# Patient Record
Sex: Female | Born: 1960 | Race: White | Hispanic: No | Marital: Single | Smoking: Current every day smoker
Health system: Southern US, Community
[De-identification: ages and names within clinical notes are randomized; demographics above are authoritative.]

## PROBLEM LIST (undated history)

## (undated) DIAGNOSIS — R519 Headache, unspecified: Secondary | ICD-10-CM

## (undated) DIAGNOSIS — F329 Major depressive disorder, single episode, unspecified: Secondary | ICD-10-CM

## (undated) DIAGNOSIS — I10 Essential (primary) hypertension: Secondary | ICD-10-CM

## (undated) DIAGNOSIS — N951 Menopausal and female climacteric states: Secondary | ICD-10-CM

## (undated) DIAGNOSIS — F32A Depression, unspecified: Secondary | ICD-10-CM

## (undated) DIAGNOSIS — K219 Gastro-esophageal reflux disease without esophagitis: Secondary | ICD-10-CM

## (undated) DIAGNOSIS — F419 Anxiety disorder, unspecified: Secondary | ICD-10-CM

## (undated) DIAGNOSIS — R51 Headache: Secondary | ICD-10-CM

## (undated) DIAGNOSIS — R6882 Decreased libido: Secondary | ICD-10-CM

## (undated) DIAGNOSIS — M542 Cervicalgia: Secondary | ICD-10-CM

## (undated) DIAGNOSIS — D649 Anemia, unspecified: Secondary | ICD-10-CM

## (undated) DIAGNOSIS — F909 Attention-deficit hyperactivity disorder, unspecified type: Secondary | ICD-10-CM

## (undated) HISTORY — DX: Major depressive disorder, single episode, unspecified: F32.9

## (undated) HISTORY — DX: Headache: R51

## (undated) HISTORY — PX: BLADDER SURGERY: SHX569

## (undated) HISTORY — DX: Headache, unspecified: R51.9

## (undated) HISTORY — PX: ABDOMINAL HYSTERECTOMY: SHX81

## (undated) HISTORY — DX: Attention-deficit hyperactivity disorder, unspecified type: F90.9

## (undated) HISTORY — DX: Depression, unspecified: F32.A

## (undated) HISTORY — DX: Essential (primary) hypertension: I10

## (undated) HISTORY — PX: BREAST ENHANCEMENT SURGERY: SHX7

## (undated) HISTORY — DX: Anxiety disorder, unspecified: F41.9

## (undated) HISTORY — DX: Cervicalgia: M54.2

## (undated) HISTORY — DX: Menopausal and female climacteric states: N95.1

## (undated) HISTORY — DX: Gastro-esophageal reflux disease without esophagitis: K21.9

## (undated) HISTORY — DX: Decreased libido: R68.82

## (undated) HISTORY — PX: CARPAL TUNNEL RELEASE: SHX101

## (undated) HISTORY — DX: Anemia, unspecified: D64.9

---

## 2007-08-07 ENCOUNTER — Ambulatory Visit: Payer: Self-pay | Admitting: Oncology

## 2009-01-31 ENCOUNTER — Ambulatory Visit: Payer: Self-pay | Admitting: Internal Medicine

## 2009-09-27 IMAGING — CT CT HEAD WITHOUT CONTRAST
2 series · 16 of 30 positions shown, 20 images · non-contrast
Comparison: none

REASON FOR EXAM: fall    HA    memory loss   CALL report  [id number]
COMMENTS:

[Series 2: without · axial · non-contrast · 0.39mm/px · z∈[+683,+803]mm · 13 of 30 slices shown, 17 images]
[im 3/30  brain]
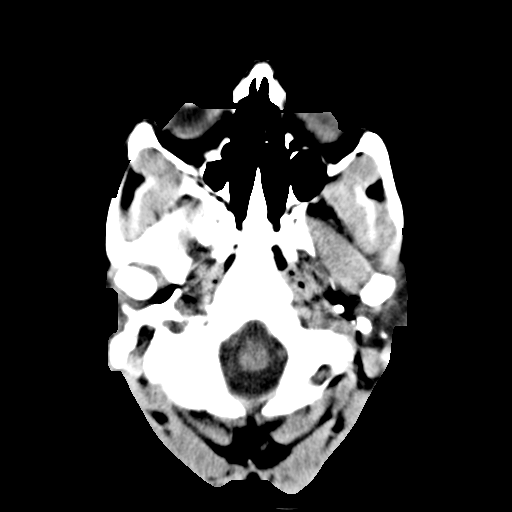
[im 3/30  bone]
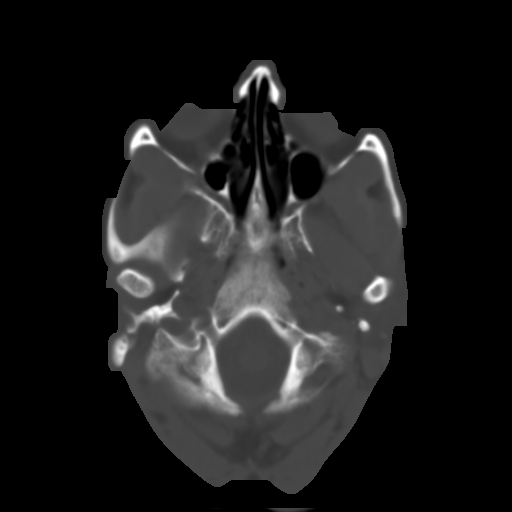
[im 5/30  brain]
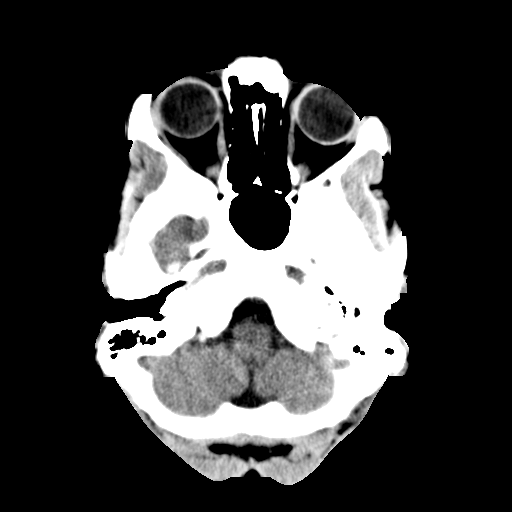
[im 7/30  brain]
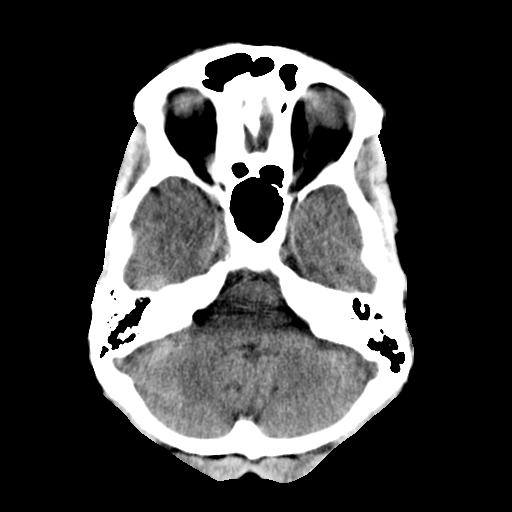
[im 9/30  brain]
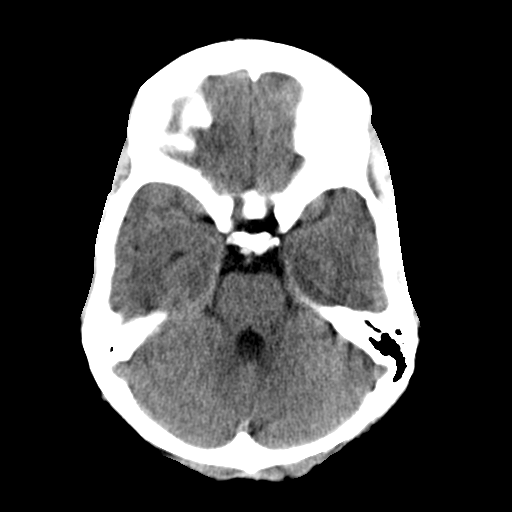
[im 11/30  brain]
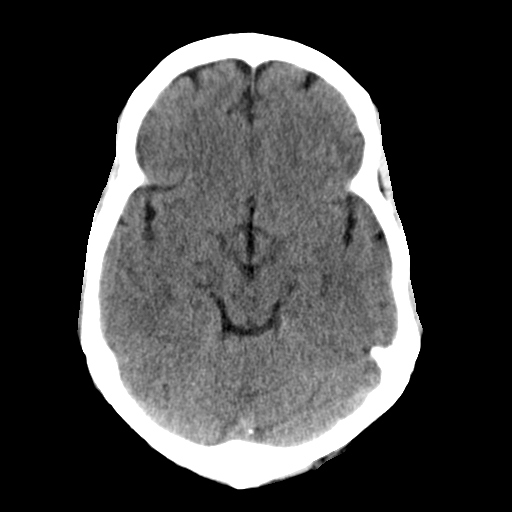
[im 11/30  bone]
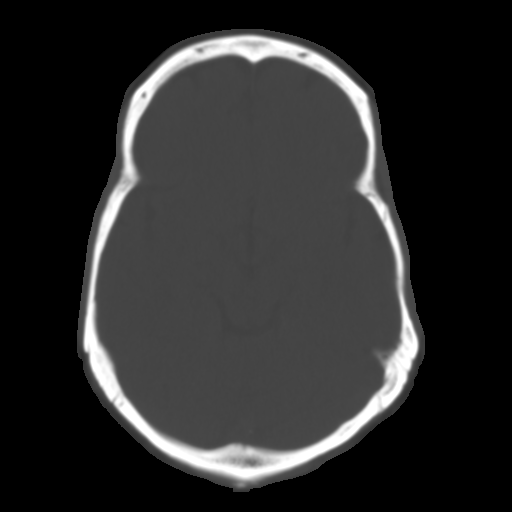
[im 13/30  brain]
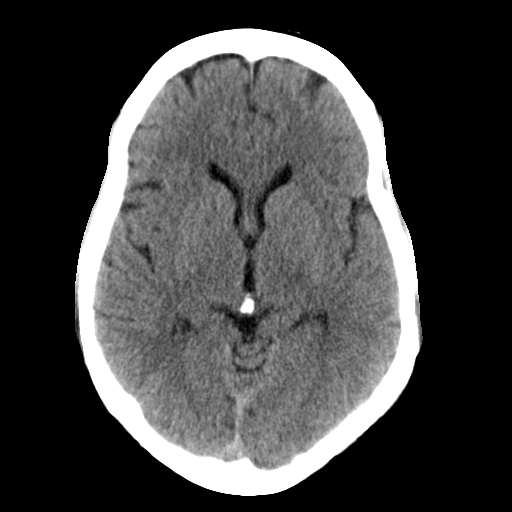
[im 15/30  brain]
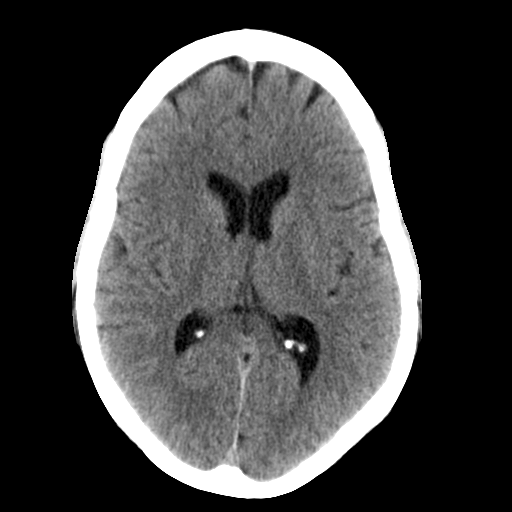
[im 17/30  brain]
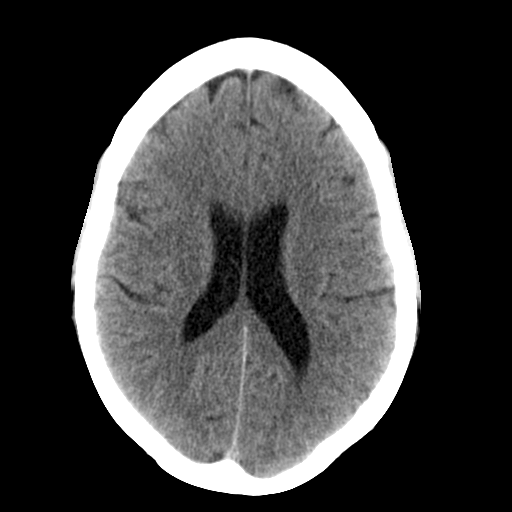
[im 19/30  brain]
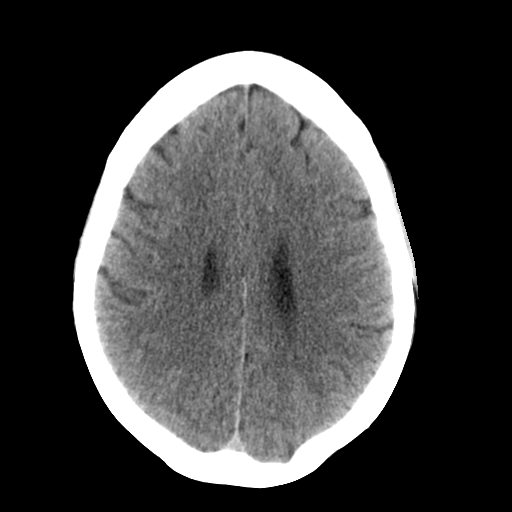
[im 19/30  bone]
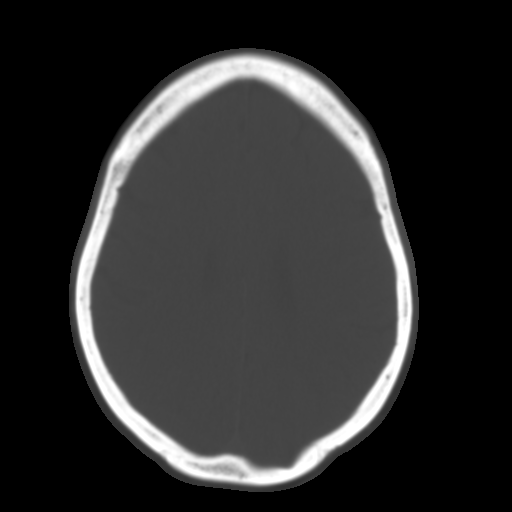
[im 21/30  brain]
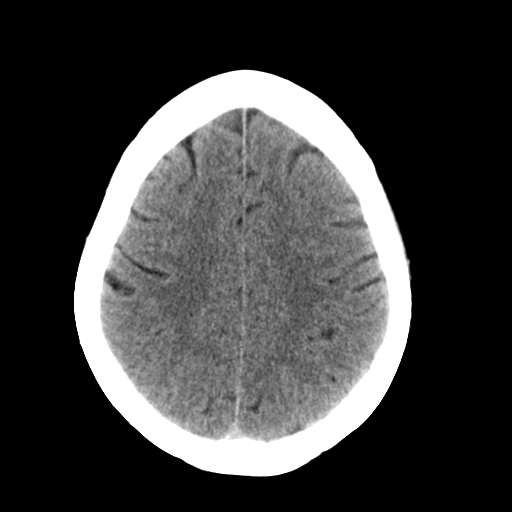
[im 23/30  brain]
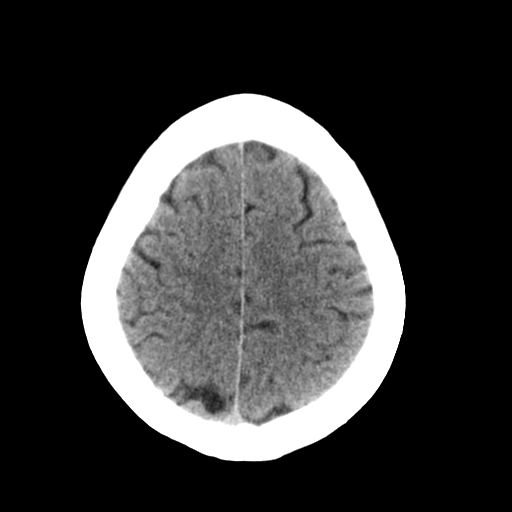
[im 25/30  brain]
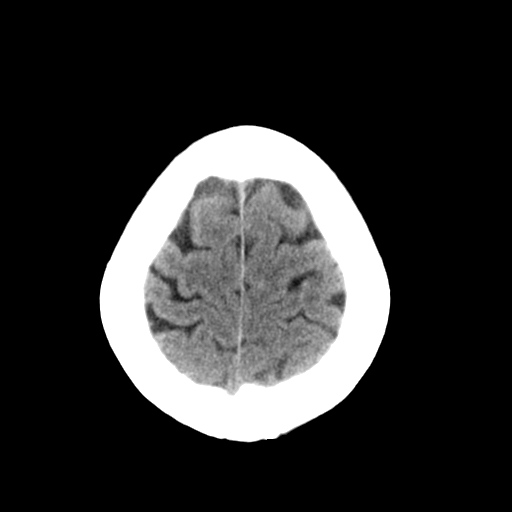
[im 27/30  brain]
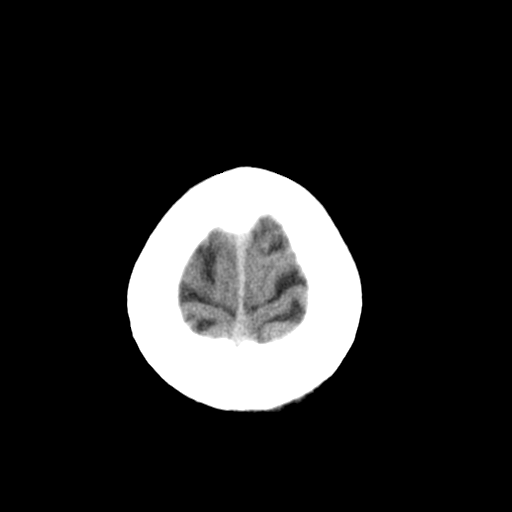
[im 27/30  bone]
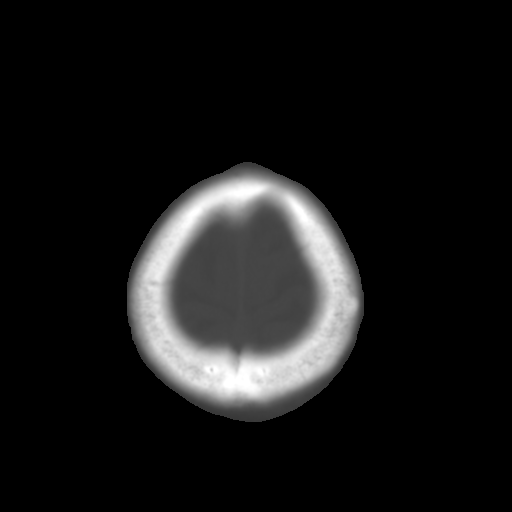

[Series 3: bone · axial · 0.39mm/px · z∈[+683,+723]mm · 3 of 30 slices shown]
[im 3/30  bone]
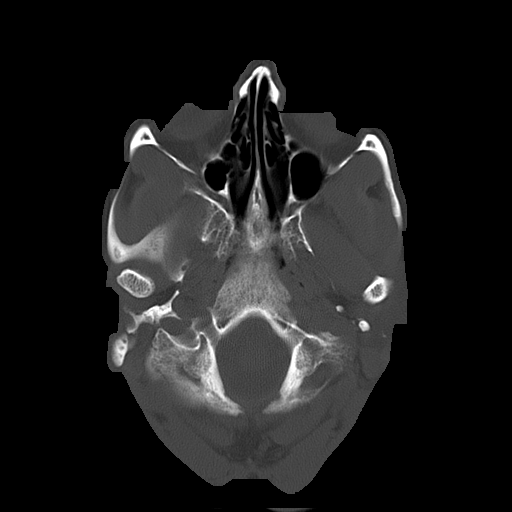
[im 7/30  bone]
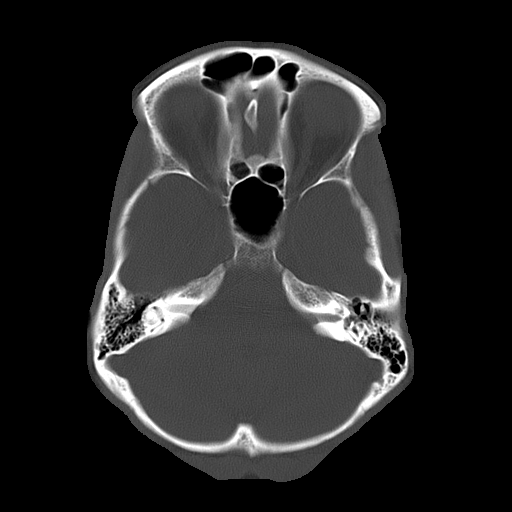
[im 11/30  bone]
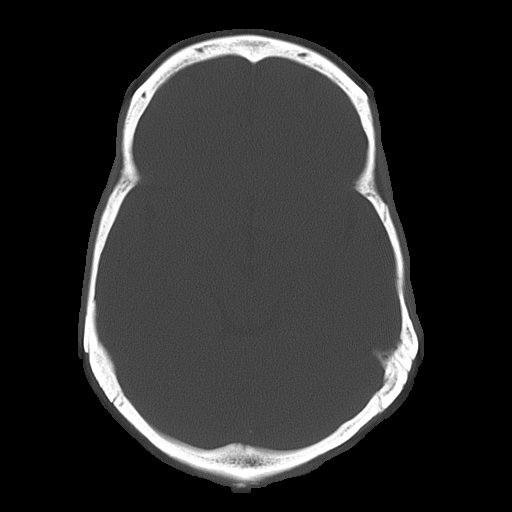

[16 of 30 positions shown; findings below may reference images not displayed]

PROCEDURE:     CT  - CT HEAD WITHOUT CONTRAST  - January 31, 2009 [DATE]

RESULT:     Noncontrast emergent CT of the brain is performed in the
standard fashion. There is no previous exam for comparison.

The ventricles and sulci are normal. There is no hemorrhage. There is no
focal mass, mass-effect or midline shift. There is no evidence of edema or
territorial infarct. The bone windows demonstrate normal aeration of the
paranasal sinuses and mastoid air cells. There is no skull fracture
demonstrated.
IMPRESSION: 1. No acute intracranial abnormality.

## 2011-01-05 ENCOUNTER — Ambulatory Visit: Payer: Self-pay | Admitting: Gastroenterology

## 2011-01-07 ENCOUNTER — Ambulatory Visit: Payer: Self-pay | Admitting: Gastroenterology

## 2011-01-07 LAB — PATHOLOGY REPORT

## 2011-12-22 ENCOUNTER — Ambulatory Visit: Payer: Self-pay | Admitting: Obstetrics and Gynecology

## 2011-12-28 ENCOUNTER — Ambulatory Visit: Payer: Self-pay | Admitting: Obstetrics and Gynecology

## 2012-08-17 IMAGING — US ULTRASOUND LEFT BREAST
1 series · 13 of 25 positions shown · non-contrast
Comparison: Mammograms from [facility] of 11/11/2011 and mammograms
from [REDACTED] [HOSPITAL] 07/09/2010 as well as 07/18/2009.

REASON FOR EXAM: LT BR MASS 9 OCLOCK
COMMENTS:

PROCEDURE:     US  - US BREAST LEFT  - December 22, 2011 [DATE]
RESULT:

[Series 1: ultrasound left breast · 0.08mm/px · 13 of 28 slices shown]
[im 1/28]
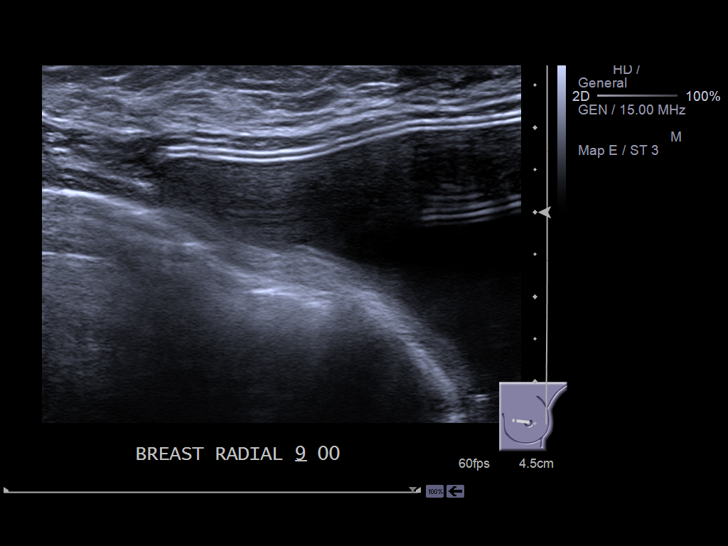
[im 3/28]
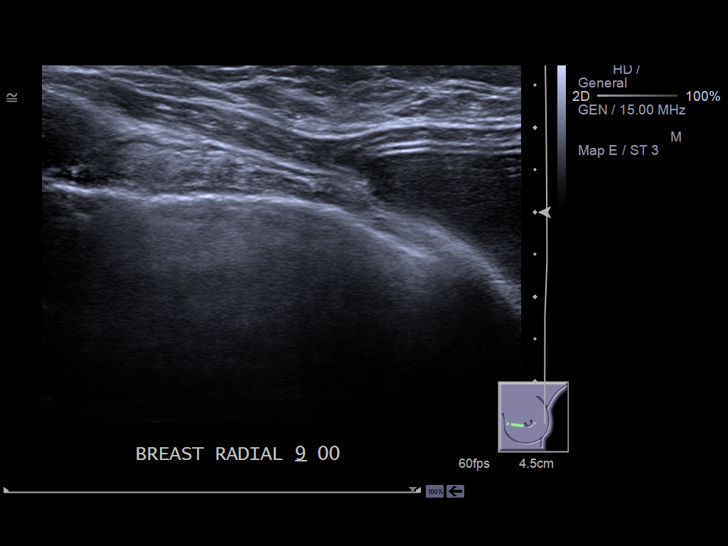
[im 5/28]
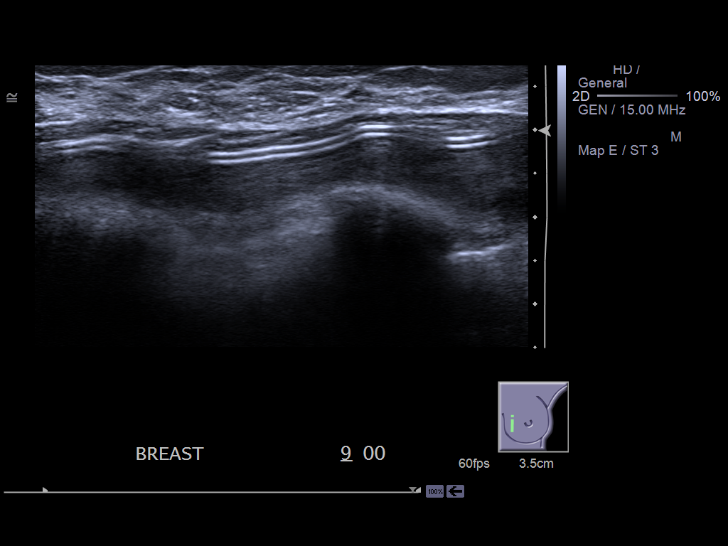
[im 7/28]
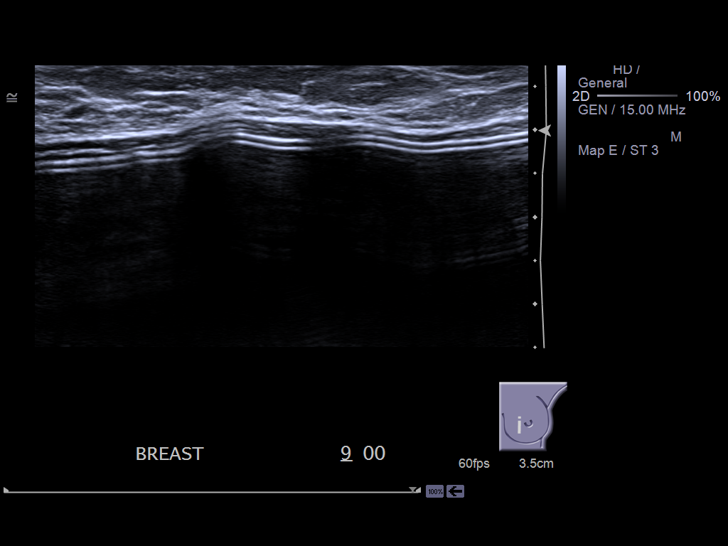
[im 10/28]
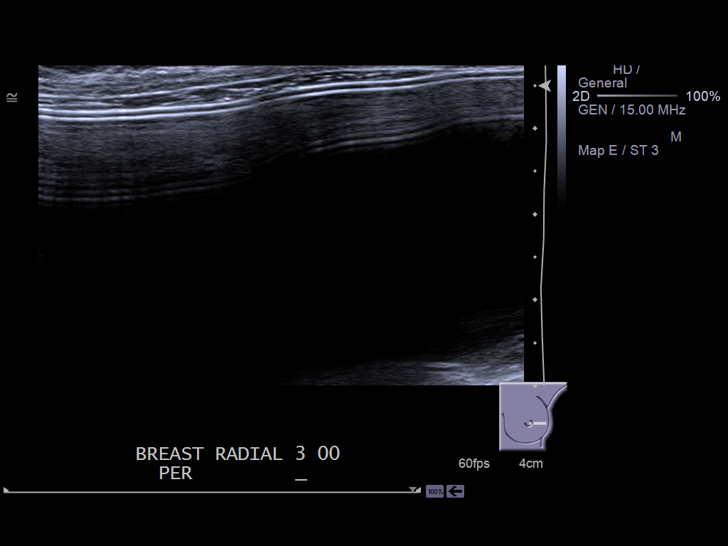
[im 12/28]
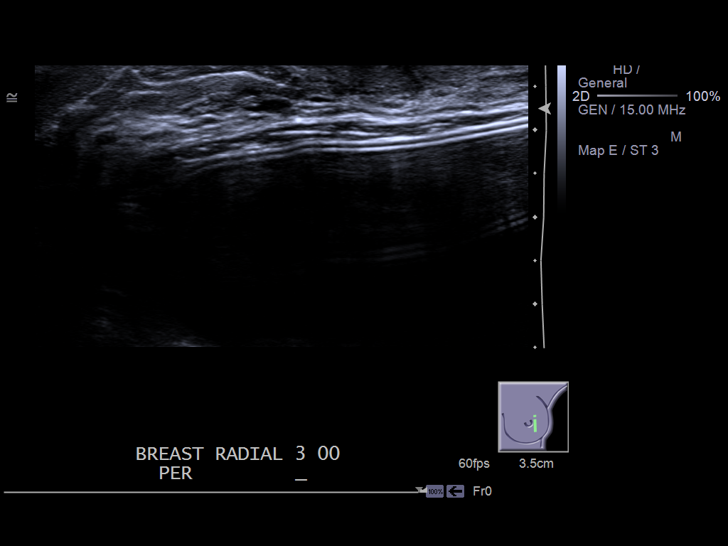
[im 14/28]
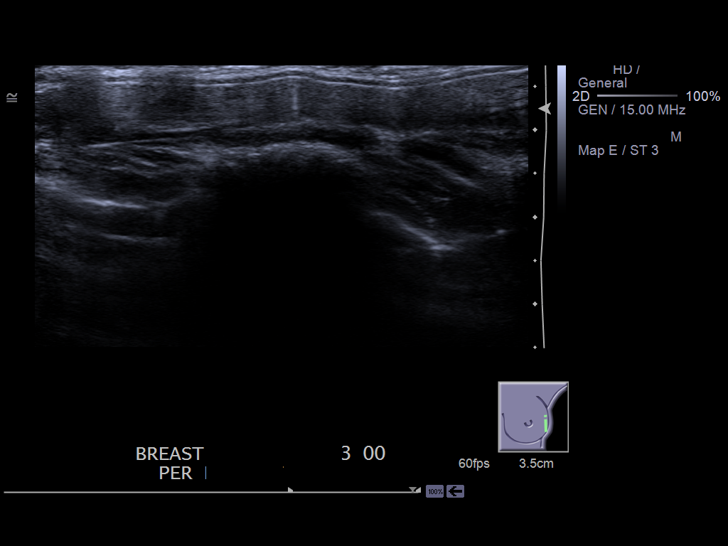
[im 16/28]
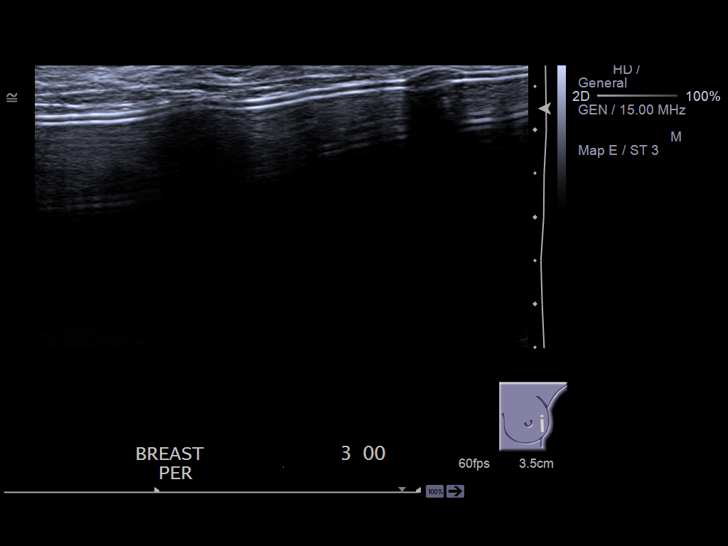
[im 19/28]
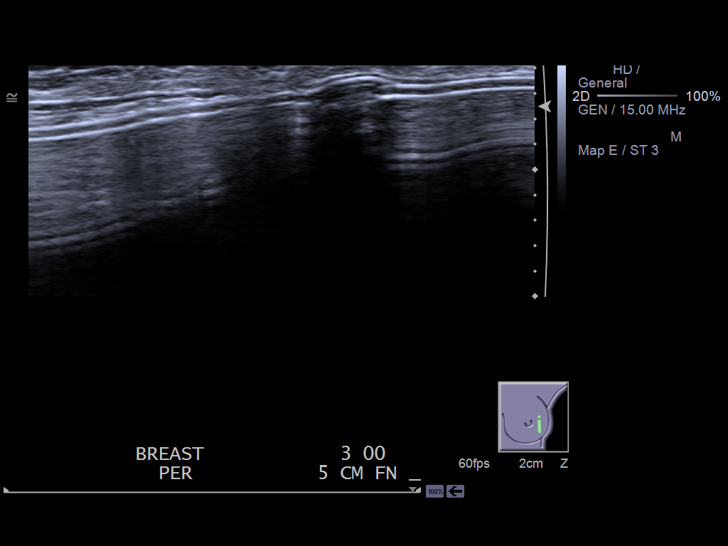
[im 21/28]
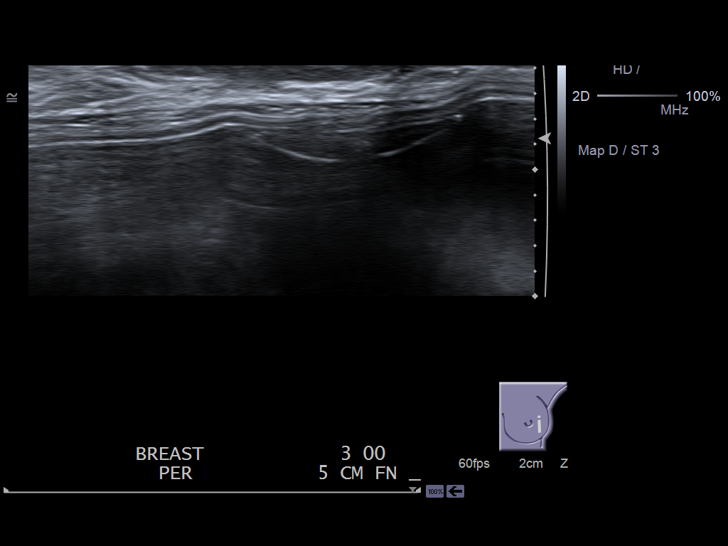
[im 23/28]
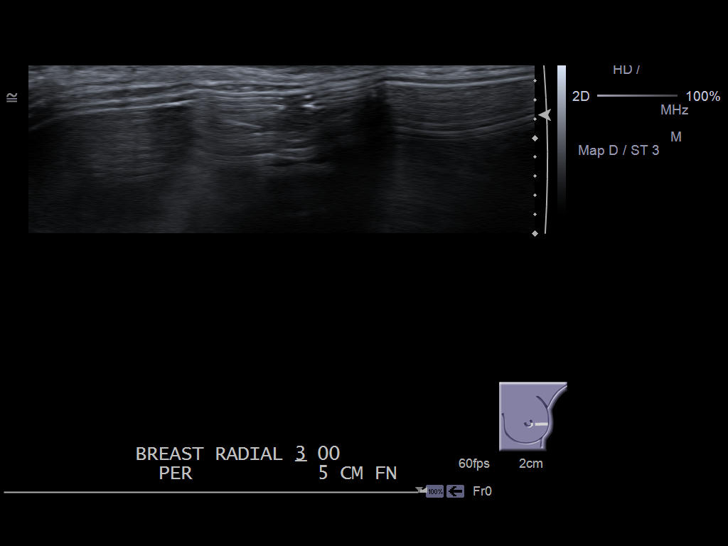
[im 25/28]
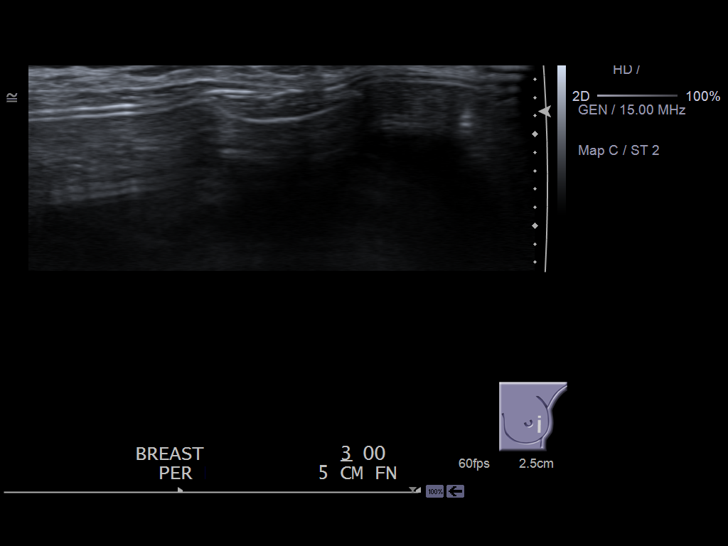
[im 28/28]
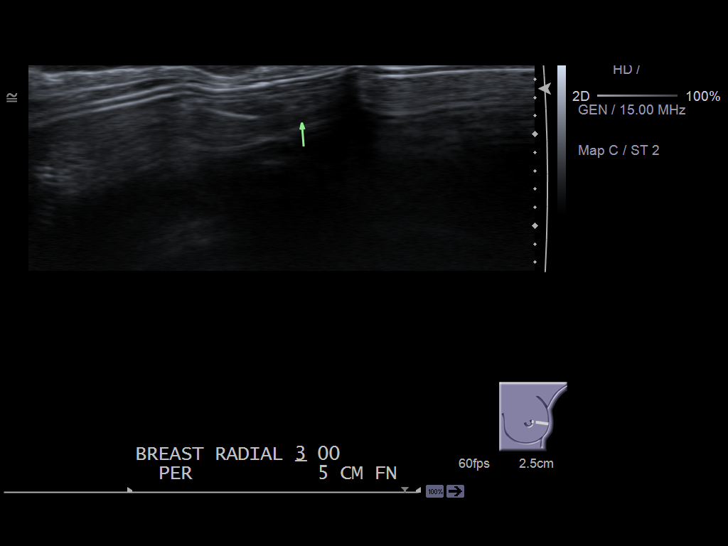

[13 of 25 positions shown; findings below may reference images not displayed]

FINDINGS: Diagnostic mammograms performed of the left breast secondary to reported
"indentation" in the lateral left breast at 3 o'clock.  A breast implant is
present, which lowers the sensitivity of mammography.  The breast tissue is
heterogenously dense. By report from the patient, there is no area of
palpable concern as this area was mainly denoted by visual indentation of
the skin. Additionally, the indication for the study states mass at 9
o'clock. However, the patient reports that the area of concern is actually
in the lateral left breast at 3 o'clock.

Spot compression magnification views were performed of the region of
interest in the far lateral aspect of the left breast. This was somewhat
difficult due to the far lateral nature and relative lack of tissue in this
region. No mass or focal symmetry was identified.

Real-time ultrasound was performed of the lateral left breast at 3 o'clock
secondary to the reported indentation at this site as well as the medial
left breast at 9 o'clock as this was the indication given on the study. No
mass or suspicious shadowing was identified. There is normal linear
echogenicity from the breast implant.
IMPRESSION: 1.  No mass is identified along the far lateral left breast at 3 o'clock at
the site of reported "visual indentation." Recommend further evaluation and
management of this area of concern be based on clinical grounds.  If this
region remains clinically suspicious, surgical consultation would be
recommended.
2.  Please note the indication states left breast mass at 9 o'clock.
However, the patient reported the area of concern as in the lateral left
breast at 3 o'clock, which was evaluated today.

BI-RADS: Category 1 -Negative

## 2013-07-24 LAB — HM PAP SMEAR: HM Pap smear: NEGATIVE

## 2014-01-12 ENCOUNTER — Emergency Department: Payer: Self-pay | Admitting: Emergency Medicine

## 2015-04-06 ENCOUNTER — Other Ambulatory Visit: Payer: Self-pay | Admitting: Obstetrics and Gynecology

## 2015-08-25 ENCOUNTER — Other Ambulatory Visit: Payer: Self-pay | Admitting: Obstetrics and Gynecology

## 2015-08-25 ENCOUNTER — Encounter: Payer: Self-pay | Admitting: *Deleted

## 2015-08-26 ENCOUNTER — Ambulatory Visit (INDEPENDENT_AMBULATORY_CARE_PROVIDER_SITE_OTHER): Payer: BLUE CROSS/BLUE SHIELD | Admitting: Obstetrics and Gynecology

## 2015-08-26 ENCOUNTER — Encounter: Payer: Self-pay | Admitting: Obstetrics and Gynecology

## 2015-08-26 VITALS — BP 142/80 | HR 86 | Ht 62.5 in | Wt 113.8 lb

## 2015-08-26 DIAGNOSIS — Z01419 Encounter for gynecological examination (general) (routine) without abnormal findings: Secondary | ICD-10-CM | POA: Diagnosis not present

## 2015-08-26 NOTE — Progress Notes (Signed)
Subjective:   Ashley Riley is a 55 y.o. G2P0012 Caucasian female here for a routine well-woman exam.  No LMP recorded. Patient has had a hysterectomy.    Current complaints: none PCP: [name]       doesn't desire labs-had last month at PCP  Social History: Sexual: heterosexual Marital Status: married Living situation: with spouse Occupation: retired Tobacco/alcohol: no tobacco use Illicit drugs: no history of illicit drug use  The following portions of the patient's history were reviewed and updated as appropriate: allergies, current medications, past family history, past medical history, past social history, past surgical history and problem list.  Past Medical History Past Medical History  Diagnosis Date  . GERD (gastroesophageal reflux disease)   . Cervicalgia   . Headache   . Anemia   . ADHD (attention deficit hyperactivity disorder)   . Hypertension   . Decreased libido   . Perimenopausal vasomotor symptoms     Past Surgical History Past Surgical History  Procedure Laterality Date  . Breast enhancement surgery    . Bladder surgery    . Carpal tunnel release Right   . Abdominal hysterectomy      Gynecologic History [id number]  No LMP recorded. Patient has had a hysterectomy. Contraception: post menopausal status Last Pap: 2015. Results were: normal Last mammogram: 2016. Results were: normal-bilateral implants  Obstetric History OB History  Gravida Para Term Preterm AB SAB TAB Ectopic Multiple Living  # Outcome Date GA Lbr Len/2nd Weight Sex Delivery Anes PTL Lv  3 Gravida 1993    M Vag-Spont   Y  2 Gravida 1990    M Vag-Spont   Y  1 TAB 1983              Current Medications Current Outpatient Prescriptions on File Prior to Visit  Medication Sig Dispense Refill  . ALPRAZolam (XANAX) 0.5 MG tablet Take 0.5 mg by mouth at bedtime as needed for anxiety.    Marland Kitchen amLODipine (NORVASC) 5 MG tablet Take 5 mg by mouth daily.    Marland Kitchen escitalopram  (LEXAPRO) 20 MG tablet Take 20 mg by mouth daily.    Marland Kitchen estrogen-methylTESTOSTERone (ESTRATEST) 1.25-2.5 MG tablet TAKE 1 TABLET BY MOUTH DAILY 90 tablet 0  . meloxicam (MOBIC) 7.5 MG tablet Take 7.5 mg by mouth daily.    Marland Kitchen zolpidem (AMBIEN) 5 MG tablet Take 5 mg by mouth at bedtime as needed for sleep.    . ARIPiprazole (ABILIFY) 10 MG tablet Take 10 mg by mouth daily. Reported on 08/26/2015     No current facility-administered medications on file prior to visit.    Review of Systems Patient denies any headaches, blurred vision, shortness of breath, chest pain, abdominal pain, problems with bowel movements, urination, or intercourse.  Objective:  BP 142/80 mmHg  Pulse 86  Ht 5' 2.5" (1.588 m)  Wt 113 lb 12.8 oz (51.619 kg)  BMI 20.47 kg/m2 Physical Exam  General:  Well developed, well nourished, no acute distress. She is alert and oriented x3. Skin:  Warm and dry Neck:  Midline trachea, no thyromegaly or nodules Cardiovascular: Regular rate and rhythm, no murmur heard Lungs:  Effort normal, all lung fields clear to auscultation bilaterally Breasts:  No dominant palpable mass, retraction, or nipple discharge, bilateral implants without defect Abdomen:  Soft, non tender, no hepatosplenomegaly or masses Pelvic:  External genitalia is normal in appearance.  The vagina is normal in appearance.  The cervix is bulbous, no CMT.  Thin prep pap is not done . Uterus is felt to be normal size, shape, and contour.  No adnexal masses or tenderness noted. Extremities:  No swelling or varicosities noted Psych:  She has a normal mood and affect  Assessment:   Healthy well-woman exam HRT Anxiety   Plan:  HRT refilled F/U 1 year for AE, or sooner if needed Mammogram scheduled  [name] [name], CNM

## 2015-08-26 NOTE — Patient Instructions (Signed)
  Place annual gynecologic exam patient instructions here.  Thank you for enrolling in MyChart. Please follow the instructions below to securely access your online medical record. MyChart allows you to send messages to your doctor, view your test results, manage appointments, and more.   How Do I Sign Up? 1. In your Internet browser, go to [employer] Address [employer] and enter [email or web address]. 2. Click on the Sign Up Now link in the Sign In box. You will see the New Member Sign Up page. 3. Enter your MyChart Access Code exactly as it appears below. You will not need to use this code after you've completed the sign-up process. If you do not sign up before the expiration date, you must request a new code.  MyChart Access Code: [email or web address] Expires: 10/25/2015  1:51 PM  4. Enter your Social Security Number ([id number]) and Date of Birth (mm/dd/yyyy) as indicated and click Submit. You will be taken to the next sign-up page. 5. Create a MyChart ID. This will be your MyChart login ID and cannot be changed, so think of one that is secure and easy to remember. 6. Create a MyChart password. You can change your password at any time. 7. Enter your Password Reset Question and Answer. This can be used at a later time if you forget your password.  8. Enter your e-mail address. You will receive e-mail notification when new information is available in MyChart. 9. Click Sign Up. You can now view your medical record.   Additional Information Remember, MyChart is NOT to be used for urgent needs. For medical emergencies, dial 911.

## 2015-11-26 ENCOUNTER — Other Ambulatory Visit: Payer: Self-pay | Admitting: *Deleted

## 2015-11-26 ENCOUNTER — Telehealth: Payer: Self-pay | Admitting: Obstetrics and Gynecology

## 2015-11-26 MED ORDER — EST ESTROGENS-METHYLTEST 1.25-2.5 MG PO TABS: 1.0000 | ORAL_TABLET | Freq: Every day | ORAL | Status: DC

## 2015-11-26 NOTE — Telephone Encounter (Signed)
Pt called and said her refills are out and she didn't know if she needed an appt, but she said she needed it today.

## 2015-11-26 NOTE — Telephone Encounter (Signed)
Done-ac 

## 2015-11-27 ENCOUNTER — Telehealth: Payer: Self-pay | Admitting: Obstetrics and Gynecology

## 2015-11-27 NOTE — Telephone Encounter (Signed)
PT SAID PHARMACY DID NOT GET HER RX FOR ESTROGEN [facility] [address] PLEASE SEND AND SHE SAID SHE WANTED SOMEONE TO CALL HER

## 2015-11-27 NOTE — Telephone Encounter (Signed)
[facility]

## 2015-12-15 ENCOUNTER — Other Ambulatory Visit: Payer: Self-pay | Admitting: Internal Medicine

## 2015-12-15 DIAGNOSIS — Z1231 Encounter for screening mammogram for malignant neoplasm of breast: Secondary | ICD-10-CM

## 2015-12-30 ENCOUNTER — Ambulatory Visit: Payer: Self-pay | Attending: Internal Medicine

## 2016-08-13 ENCOUNTER — Telehealth: Payer: Self-pay | Admitting: Obstetrics and Gynecology

## 2016-08-13 ENCOUNTER — Telehealth: Payer: Self-pay | Admitting: *Deleted

## 2016-08-13 NOTE — Telephone Encounter (Signed)
Patient called because she needs a refill on her estrogen and she's going out of town. She states that she really needs this before she leaves. [facility] [address] Please advise

## 2016-08-13 NOTE — Telephone Encounter (Signed)
Pt aware per vm estratest is considered a controlled substance. Med will need to be picked up at office. Asked pt to call me back.

## 2016-08-13 NOTE — Telephone Encounter (Signed)
Patient called and states that she would like [place] she is going out of town. [facility] [place] [facility] [address] # [phone]. Please advise. Thank you

## 2016-08-16 NOTE — Telephone Encounter (Signed)
Patient called again this AM about her medication . I verbalized to her the message that her medication was a controlled substance and that it had to be signed by Dr. Tommi Rumpse. And that he is in surgery today. Patient is aware of that .  She states that she will call back tomorrow at noon to see if the RX is ready. Please advise. Thank you

## 2016-08-17 MED ORDER — EST ESTROGENS-METHYLTEST 1.25-2.5 MG PO TABS: 1.0000 | ORAL_TABLET | Freq: Every day | ORAL | 0 refills | Status: DC

## 2016-08-17 NOTE — Telephone Encounter (Signed)
Pt aware rx left up front for p/u.  

## 2016-08-20 NOTE — Progress Notes (Signed)
ANNUAL PREVENTATIVE CARE GYN  ENCOUNTER NOTE  Subjective:       Ashley Riley is a 56 y.o. [phone] female here for a routine annual gynecologic exam.  Current complaints: 1.   none   Gynecologic History No LMP recorded. Patient has had a hysterectomy. Contraception: status post hysterectomy- LSH Last Pap: 07/24/2013 neg/neg. Results were: normal Last mammogram: 2016 Results were: normal  Obstetric History OB History  Gravida Para Term Preterm AB Living  3       1 2   SAB TAB Ectopic Multiple Live Births    1     2    # Outcome Date GA Lbr Len/2nd Weight Sex Delivery Anes PTL Lv  3 Gravida 1993    M Vag-Spont   LIV  2 Gravida 1990    M Vag-Spont   LIV  1 TAB 1983              Past Medical History:  Diagnosis Date  . ADHD (attention deficit hyperactivity disorder)   . Anemia   . Cervicalgia   . Decreased libido   . GERD (gastroesophageal reflux disease)   . Headache   . Hypertension   . Perimenopausal vasomotor symptoms     Past Surgical History:  Procedure Laterality Date  . ABDOMINAL HYSTERECTOMY    . BLADDER SURGERY    . BREAST ENHANCEMENT SURGERY    . CARPAL TUNNEL RELEASE Right     Current Outpatient Prescriptions on File Prior to Visit  Medication Sig Dispense Refill  . ALPRAZolam (XANAX) 0.5 MG tablet Take 0.5 mg by mouth at bedtime as needed for anxiety.    Marland Kitchen amLODipine (NORVASC) 5 MG tablet Take 5 mg by mouth daily.    . ARIPiprazole (ABILIFY) 10 MG tablet Take 10 mg by mouth daily. Reported on 08/26/2015    . escitalopram (LEXAPRO) 20 MG tablet Take 20 mg by mouth daily.    Marland Kitchen estrogens-methylTEST (ESTRATEST) 1.25-2.5 MG tablet Take 1 tablet by mouth daily. 90 tablet 0  . meloxicam (MOBIC) 7.5 MG tablet Take 7.5 mg by mouth daily.    Marland Kitchen zolpidem (AMBIEN) 5 MG tablet Take 5 mg by mouth at bedtime as needed for sleep.     No current facility-administered medications on file prior to visit.     Allergies  Allergen Reactions  . Nsaids     Social  History   Social History  . Marital status: Single    Spouse name: N/A  . Number of children: N/A  . Years of education: N/A   Occupational History  . Not on file.   Social History Main Topics  . Smoking status: Current Every Day Smoker  . Smokeless tobacco: Never Used  . Alcohol use Yes     Comment: daily  . Drug use: No  . Sexual activity: Yes    Birth control/ protection: Surgical   Other Topics Concern  . Not on file   Social History Narrative  . No narrative on file    Family History  Problem Relation Age of Onset  . Cancer Maternal Aunt     breast    The following portions of the patient's history were reviewed and updated as appropriate: allergies, current medications, past family history, past medical history, past social history, past surgical history and problem list.  Review of Systems Review of Systems  Constitutional: Negative.        No vasomotor symptoms while on Estratest  HENT: Negative.   Respiratory: Negative.  Cardiovascular: Negative.   Gastrointestinal: Negative.        History of reflux  Genitourinary: Negative.        Vaginal dryness and discomfort with intercourse is noted  Musculoskeletal: Negative.   Skin: Negative.   Neurological: Negative.   Endo/Heme/Allergies: Negative.   Psychiatric/Behavioral: Negative.    Objective:   BP (!) 147/89   Pulse 80   Ht 5' 3.5" (1.613 m)   Wt 121 lb 14.4 oz (55.3 kg)   BMI 21.25 kg/m CONSTITUTIONAL: Well-developed, well-nourished female in no acute distress.  PSYCHIATRIC: Normal mood and affect. Normal behavior. Normal judgment and thought content. NEUROLGIC: Alert and oriented to person, place, and time. Normal muscle tone coordination. No cranial nerve deficit noted. HENT:  Normocephalic, atraumatic, External right and left ear normal. Oropharynx is clear and moist EYES: Conjunctivae and EOM are normal.  No scleral icterus.  NECK: Normal range of motion, supple, no masses.  Normal thyroid.   SKIN: Skin is warm and dry. No rash noted. Not diaphoretic. No erythema. No pallor. CARDIOVASCULAR: Normal heart rate noted, regular rhythm, no murmur. RESPIRATORY: Clear to auscultation bilaterally. Effort and breath sounds normal, no problems with respiration noted. BREASTS: Symmetric in size. No masses, skin changes, nipple drainage, or lymphadenopathy.Breast implants are noted, nontender; surgical scars are well-healed ABDOMEN: Soft, normal bowel sounds, no distention noted.  No tenderness, rebound or guarding.  BLADDER: Normal PELVIC:  External Genitalia: Normal  BUS: Normal  Vagina: Normal; good estrogen effect  Cervix: Normal; no lesions; no cervical motion tenderness  Uterus: Surgically absent  Adnexa: Normal  RV: External Exam NormaI, No Rectal Masses and Normal Sphincter tone  MUSCULOSKELETAL: Normal range of motion. No tenderness.  No cyanosis, clubbing, or edema.  2+ distal pulses. LYMPHATIC: No Axillary, Supraclavicular, or Inguinal Adenopathy.    Assessment:   Annual gynecologic examination 56 y.o. Contraception: status post hysterectomy BMI-21 Tobacco user Vasomotor symptoms controlled with ERT Vaginal dryness, symptomatic Plan:  Pap: Pap Co Test Mammogram: Ordered Stool Guaiac Testing:  Ordered Labs: thru pcp Routine preventative health maintenance measures emphasized: Exercise/Diet/Weight control, Tobacco Warnings and Alcohol/Substance use risks Estratest is refilled for 1 year Trial of [place] vaginal suppositories for vaginal dryness is prescribed Return to Clinic - 1 Year   SunGardCrystal Miller, CMA  Herold HarmsMartin A Reign Dziuba, MD  Note: This dictation was prepared with Dragon dictation along with smaller phrase technology. Any transcriptional errors that result from this process are unintentional.

## 2016-08-26 ENCOUNTER — Encounter: Payer: Self-pay | Admitting: Obstetrics and Gynecology

## 2016-08-26 ENCOUNTER — Ambulatory Visit (INDEPENDENT_AMBULATORY_CARE_PROVIDER_SITE_OTHER): Payer: BLUE CROSS/BLUE SHIELD | Admitting: Obstetrics and Gynecology

## 2016-08-26 VITALS — BP 147/89 | HR 80 | Ht 63.5 in | Wt 121.9 lb

## 2016-08-26 DIAGNOSIS — Z780 Asymptomatic menopausal state: Secondary | ICD-10-CM | POA: Diagnosis not present

## 2016-08-26 DIAGNOSIS — Z1231 Encounter for screening mammogram for malignant neoplasm of breast: Secondary | ICD-10-CM

## 2016-08-26 DIAGNOSIS — Z01419 Encounter for gynecological examination (general) (routine) without abnormal findings: Secondary | ICD-10-CM

## 2016-08-26 DIAGNOSIS — Z720 Tobacco use: Secondary | ICD-10-CM | POA: Insufficient documentation

## 2016-08-26 DIAGNOSIS — N952 Postmenopausal atrophic vaginitis: Secondary | ICD-10-CM | POA: Diagnosis not present

## 2016-08-26 DIAGNOSIS — Z1211 Encounter for screening for malignant neoplasm of colon: Secondary | ICD-10-CM

## 2016-08-26 DIAGNOSIS — F419 Anxiety disorder, unspecified: Secondary | ICD-10-CM

## 2016-08-26 DIAGNOSIS — Z1239 Encounter for other screening for malignant neoplasm of breast: Secondary | ICD-10-CM

## 2016-08-26 MED ORDER — EST ESTROGENS-METHYLTEST 1.25-2.5 MG PO TABS: 1.0000 | ORAL_TABLET | Freq: Every day | ORAL | 0 refills | Status: DC

## 2016-08-26 NOTE — Patient Instructions (Signed)
1. Pap smear is completed 2. Mammogram is ordered 3. Guaiac lines are given for colon cancer screening 4. Patient is encouraged to continue with healthy eating and exercise 5. Recommend calcium 600 mg twice a day and vitamin D 400 units twice a day. Options of calcium include:  Citracal  Os-Cal  Tums  Viactiv chews 6. Smoking cessation is encouraged 7. Estratest is refilled for 1 year 8. Return in 1 year for annual exam 9. Intrarosa vaginal suppositories for management of vaginal dryness is started   Health Maintenance for Postmenopausal Women Menopause is a normal process in which your reproductive ability comes to an end. This process happens gradually over a span of months to years, usually between the ages of 80 and 67. Menopause is complete when you have missed 12 consecutive menstrual periods. It is important to talk with your health care provider about some of the most common conditions that affect postmenopausal women, such as heart disease, cancer, and bone loss (osteoporosis). Adopting a healthy lifestyle and getting preventive care can help to promote your health and wellness. Those actions can also lower your chances of developing some of these common conditions. What should I know about menopause? During menopause, you may experience a number of symptoms, such as:  Moderate-to-severe hot flashes.  Night sweats.  Decrease in sex drive.  Mood swings.  Headaches.  Tiredness.  Irritability.  Memory problems.  Insomnia. Choosing to treat or not to treat menopausal changes is an individual decision that you make with your health care provider. What should I know about hormone replacement therapy and supplements? Hormone therapy products are effective for treating symptoms that are associated with menopause, such as hot flashes and night sweats. Hormone replacement carries certain risks, especially as you become older. If you are thinking about using estrogen or estrogen  with progestin treatments, discuss the benefits and risks with your health care provider. What should I know about heart disease and stroke? Heart disease, heart attack, and stroke become more likely as you age. This may be due, in part, to the hormonal changes that your body experiences during menopause. These can affect how your body processes dietary fats, triglycerides, and cholesterol. Heart attack and stroke are both medical emergencies. There are many things that you can do to help prevent heart disease and stroke:  Have your blood pressure checked at least every 1-2 years. High blood pressure causes heart disease and increases the risk of stroke.  If you are 27-7 years old, ask your health care provider if you should take aspirin to prevent a heart attack or a stroke.  Do not use any tobacco products, including cigarettes, chewing tobacco, or electronic cigarettes. If you need help quitting, ask your health care provider.  It is important to eat a healthy diet and maintain a healthy weight.  Be sure to include plenty of vegetables, fruits, low-fat dairy products, and lean protein.  Avoid eating foods that are high in solid fats, added sugars, or salt (sodium).  Get regular exercise. This is one of the most important things that you can do for your health.  Try to exercise for at least 150 minutes each week. The type of exercise that you do should increase your heart rate and make you sweat. This is known as moderate-intensity exercise.  Try to do strengthening exercises at least twice each week. Do these in addition to the moderate-intensity exercise.  Know your numbers.Ask your health care provider to check your cholesterol and your  blood glucose. Continue to have your blood tested as directed by your health care provider. What should I know about cancer screening? There are several types of cancer. Take the following steps to reduce your risk and to catch any cancer development  as early as possible. Breast Cancer  Practice breast self-awareness.  This means understanding how your breasts normally appear and feel.  It also means doing regular breast self-exams. Let your health care provider know about any changes, no matter how small.  If you are 63 or older, have a clinician do a breast exam (clinical breast exam or CBE) every year. Depending on your age, family history, and medical history, it may be recommended that you also have a yearly breast X-ray (mammogram).  If you have a family history of breast cancer, talk with your health care provider about genetic screening.  If you are at high risk for breast cancer, talk with your health care provider about having an MRI and a mammogram every year.  Breast cancer (BRCA) gene test is recommended for women who have family members with BRCA-related cancers. Results of the assessment will determine the need for genetic counseling and BRCA1 and for BRCA2 testing. BRCA-related cancers include these types:  Breast. This occurs in males or females.  Ovarian.  Tubal. This may also be called fallopian tube cancer.  Cancer of the abdominal or pelvic lining (peritoneal cancer).  Prostate.  Pancreatic. Cervical, Uterine, and Ovarian Cancer  Your health care provider may recommend that you be screened regularly for cancer of the pelvic organs. These include your ovaries, uterus, and vagina. This screening involves a pelvic exam, which includes checking for microscopic changes to the surface of your cervix (Pap test).  For women ages 21-65, health care providers may recommend a pelvic exam and a Pap test every three years. For women ages 69-65, they may recommend the Pap test and pelvic exam, combined with testing for human papilloma virus (HPV), every five years. Some types of HPV increase your risk of cervical cancer. Testing for HPV may also be done on women of any age who have unclear Pap test results.  Other health  care providers may not recommend any screening for nonpregnant women who are considered low risk for pelvic cancer and have no symptoms. Ask your health care provider if a screening pelvic exam is right for you.  If you have had past treatment for cervical cancer or a condition that could lead to cancer, you need Pap tests and screening for cancer for at least 20 years after your treatment. If Pap tests have been discontinued for you, your risk factors (such as having a new sexual partner) need to be reassessed to determine if you should start having screenings again. Some women have medical problems that increase the chance of getting cervical cancer. In these cases, your health care provider may recommend that you have screening and Pap tests more often.  If you have a family history of uterine cancer or ovarian cancer, talk with your health care provider about genetic screening.  If you have vaginal bleeding after reaching menopause, tell your health care provider.  There are currently no reliable tests available to screen for ovarian cancer. Lung Cancer  Lung cancer screening is recommended for adults 83-49 years old who are at high risk for lung cancer because of a history of smoking. A yearly low-dose CT scan of the lungs is recommended if you:  Currently smoke.  Have a history of at  least 30 pack-years of smoking and you currently smoke or have quit within the past 15 years. A pack-year is smoking an average of one pack of cigarettes per day for one year. Yearly screening should:  Continue until it has been 15 years since you quit.  Stop if you develop a health problem that would prevent you from having lung cancer treatment. Colorectal Cancer  This type of cancer can be detected and can often be prevented.  Routine colorectal cancer screening usually begins at age 10 and continues through age 40.  If you have risk factors for colon cancer, your health care provider may recommend  that you be screened at an earlier age.  If you have a family history of colorectal cancer, talk with your health care provider about genetic screening.  Your health care provider may also recommend using home test kits to check for hidden blood in your stool.  A small camera at the end of a tube can be used to examine your colon directly (sigmoidoscopy or colonoscopy). This is done to check for the earliest forms of colorectal cancer.  Direct examination of the colon should be repeated every 5-10 years until age 47. However, if early forms of precancerous polyps or small growths are found or if you have a family history or genetic risk for colorectal cancer, you may need to be screened more often. Skin Cancer  Check your skin from head to toe regularly.  Monitor any moles. Be sure to tell your health care provider:  About any new moles or changes in moles, especially if there is a change in a mole's shape or color.  If you have a mole that is larger than the size of a pencil eraser.  If any of your family members has a history of skin cancer, especially at a young age, talk with your health care provider about genetic screening.  Always use sunscreen. Apply sunscreen liberally and repeatedly throughout the day.  Whenever you are outside, protect yourself by wearing long sleeves, pants, a wide-brimmed hat, and sunglasses. What should I know about osteoporosis? Osteoporosis is a condition in which bone destruction happens more quickly than new bone creation. After menopause, you may be at an increased risk for osteoporosis. To help prevent osteoporosis or the bone fractures that can happen because of osteoporosis, the following is recommended:  If you are 52-49 years old, get at least 1,000 mg of calcium and at least 600 mg of vitamin D per day.  If you are older than age 63 but younger than age 62, get at least 1,200 mg of calcium and at least 600 mg of vitamin D per day.  If you are  older than age 5, get at least 1,200 mg of calcium and at least 800 mg of vitamin D per day. Smoking and excessive alcohol intake increase the risk of osteoporosis. Eat foods that are rich in calcium and vitamin D, and do weight-bearing exercises several times each week as directed by your health care provider. What should I know about how menopause affects my mental health? Depression may occur at any age, but it is more common as you become older. Common symptoms of depression include:  Low or sad mood.  Changes in sleep patterns.  Changes in appetite or eating patterns.  Feeling an overall lack of motivation or enjoyment of activities that you previously enjoyed.  Frequent crying spells. Talk with your health care provider if you think that you are experiencing depression.  What should I know about immunizations? It is important that you get and maintain your immunizations. These include:  Tetanus, diphtheria, and pertussis (Tdap) booster vaccine.  Influenza every year before the flu season begins.  Pneumonia vaccine.  Shingles vaccine. Your health care provider may also recommend other immunizations. This information is not intended to replace advice given to you by your health care provider. Make sure you discuss any questions you have with your health care provider. Document Released: 07/09/2005 Document Revised: 12/05/2015 Document Reviewed: 02/18/2015 Elsevier Interactive Patient Education  2017 [employer].

## 2016-08-28 LAB — PAP IG AND HPV HIGH-RISK
HPV, high-risk: NEGATIVE
PAP Smear Comment: 0

## 2017-01-17 ENCOUNTER — Telehealth: Payer: Self-pay | Admitting: Obstetrics and Gynecology

## 2017-01-17 MED ORDER — EST ESTROGENS-METHYLTEST 1.25-2.5 MG PO TABS: 1.0000 | ORAL_TABLET | Freq: Every day | ORAL | 0 refills | Status: DC

## 2017-01-17 NOTE — Telephone Encounter (Signed)
The patient called and lvm wanting to come and pick up her prescription for estrogens-methylTEST (ESTRATEST) 1.25-2.5 MG tablet. The patient stated that she was told to call the office when she runs out of her medication. The patient did not disclose any other information. Please advise.

## 2017-01-17 NOTE — Telephone Encounter (Signed)
Pt aware per vm. Rx left up front for pick up.

## 2017-04-28 ENCOUNTER — Telehealth: Payer: Self-pay | Admitting: Obstetrics and Gynecology

## 2017-04-28 ENCOUNTER — Other Ambulatory Visit: Payer: Self-pay | Admitting: Obstetrics and Gynecology

## 2017-04-28 NOTE — Telephone Encounter (Signed)
Patient called stating she needs a hand written script for estrogen to take with her to [place]. She states she lives part time in [place] and they wont transfer the script. Thanks

## 2017-04-29 MED ORDER — EST ESTROGENS-METHYLTEST 1.25-2.5 MG PO TABS: 1.0000 | ORAL_TABLET | Freq: Every day | ORAL | 1 refills | Status: AC

## 2017-04-29 NOTE — Telephone Encounter (Signed)
Pt needs refill of estratest now to be filled in [place]. [place] [facility] [place] [facility] [place] [facility] [facility] She request a rx to take with her. Pt aware they will be left up front for p/u.

## 2017-05-18 ENCOUNTER — Other Ambulatory Visit: Payer: Self-pay | Admitting: Sports Medicine

## 2017-05-18 DIAGNOSIS — M5136 Other intervertebral disc degeneration, lumbar region: Secondary | ICD-10-CM

## 2017-05-18 DIAGNOSIS — R200 Anesthesia of skin: Secondary | ICD-10-CM

## 2017-08-25 NOTE — Progress Notes (Deleted)
ANNUAL PREVENTATIVE CARE GYN  ENCOUNTER NOTE  Subjective:       Ashley Riley is a 57 y.o. [phone] female here for a routine annual gynecologic exam.  Current complaints: 1.   none   Gynecologic History No LMP recorded. Patient has had a hysterectomy. Contraception: status post hysterectomy- LSH Last Pap: 08/23/2016 neg/neg. Results were: normal Last mammogram: 2016 Results were: normal  Obstetric History OB History  Gravida Para Term Preterm AB Living  3       1 2   SAB TAB Ectopic Multiple Live Births    1     2    # Outcome Date GA Lbr Len/2nd Weight Sex Delivery Anes PTL Lv  3 Gravida 1993    M Vag-Spont   LIV  2 Gravida 1990    M Vag-Spont   LIV  1 TAB 1983            Past Medical History:  Diagnosis Date  . ADHD (attention deficit hyperactivity disorder)   . Anemia   . Anxiety   . Cervicalgia   . Decreased libido   . Depression   . GERD (gastroesophageal reflux disease)   . Headache   . Hypertension   . Perimenopausal vasomotor symptoms     Past Surgical History:  Procedure Laterality Date  . ABDOMINAL HYSTERECTOMY    . BLADDER SURGERY    . BREAST ENHANCEMENT SURGERY    . CARPAL TUNNEL RELEASE Right     Current Outpatient Medications on File Prior to Visit  Medication Sig Dispense Refill  . ALPRAZolam (XANAX) 0.5 MG tablet Take 0.5 mg by mouth at bedtime as needed for anxiety.    Marland Kitchen. amLODipine (NORVASC) 5 MG tablet Take 5 mg by mouth daily.    Marland Kitchen. estrogens-methylTEST (ESTRATEST) 1.25-2.5 MG tablet Take 1 tablet by mouth daily. 90 tablet 1  . meloxicam (MOBIC) 7.5 MG tablet Take 7.5 mg by mouth daily.    . pantoprazole (PROTONIX) 40 MG tablet Take by mouth.    . senna-docusate (SENOKOT-S) 8.6-50 MG tablet Use 2 tablets every other day as needed for constipation    . zolpidem (AMBIEN) 5 MG tablet Take 5 mg by mouth at bedtime as needed for sleep.     No current facility-administered medications on file prior to visit.     Allergies  Allergen Reactions   . Nsaids     Social History   Socioeconomic History  . Marital status: Single    Spouse name: Not on file  . Number of children: Not on file  . Years of education: Not on file  . Highest education level: Not on file  Occupational History  . Not on file  Social Needs  . Financial resource strain: Not on file  . Food insecurity:    Worry: Not on file    Inability: Not on file  . Transportation needs:    Medical: Not on file    Non-medical: Not on file  Tobacco Use  . Smoking status: Current Every Day Smoker    Packs/day: 0.25    Types: Cigarettes  . Smokeless tobacco: Never Used  Substance and Sexual Activity  . Alcohol use: Yes    Comment: 4 days a week  . Drug use: No  . Sexual activity: Yes    Birth control/protection: Surgical  Lifestyle  . Physical activity:    Days per week: Not on file    Minutes per session: Not on file  . Stress: Not on  file  Relationships  . Social connections:    Talks on phone: Not on file    Gets together: Not on file    Attends religious service: Not on file    Active member of club or organization: Not on file    Attends meetings of clubs or organizations: Not on file    Relationship status: Not on file  . Intimate partner violence:    Fear of current or ex partner: Not on file    Emotionally abused: Not on file    Physically abused: Not on file    Forced sexual activity: Not on file  Other Topics Concern  . Not on file  Social History Narrative  . Not on file    Family History  Problem Relation Age of Onset  . Cancer Maternal Aunt        breast  . Breast cancer Neg Hx   . Ovarian cancer Neg Hx   . Colon cancer Neg Hx   . Diabetes Neg Hx   . Heart disease Neg Hx     The following portions of the patient's history were reviewed and updated as appropriate: allergies, current medications, past family history, past medical history, past social history, past surgical history and problem list.  Review of  Systems  Objective:   There were no vitals taken for this visit.CONSTITUTIONAL: Well-developed, well-nourished female in no acute distress.  PSYCHIATRIC: Normal mood and affect. Normal behavior. Normal judgment and thought content. NEUROLGIC: Alert and oriented to person, place, and time. Normal muscle tone coordination. No cranial nerve deficit noted. HENT:  Normocephalic, atraumatic, External right and left ear normal. Oropharynx is clear and moist EYES: Conjunctivae and EOM are normal.  No scleral icterus.  NECK: Normal range of motion, supple, no masses.  Normal thyroid.  SKIN: Skin is warm and dry. No rash noted. Not diaphoretic. No erythema. No pallor. CARDIOVASCULAR: Normal heart rate noted, regular rhythm, no murmur. RESPIRATORY: Clear to auscultation bilaterally. Effort and breath sounds normal, no problems with respiration noted. BREASTS: Symmetric in size. No masses, skin changes, nipple drainage, or lymphadenopathy.Breast implants are noted, nontender; surgical scars are well-healed ABDOMEN: Soft, normal bowel sounds, no distention noted.  No tenderness, rebound or guarding.  BLADDER: Normal PELVIC:  External Genitalia: Normal  BUS: Normal  Vagina: Normal; good estrogen effect  Cervix: Normal; no lesions; no cervical motion tenderness  Uterus: Surgically absent  Adnexa: Normal  RV: External Exam NormaI, No Rectal Masses and Normal Sphincter tone  MUSCULOSKELETAL: Normal range of motion. No tenderness.  No cyanosis, clubbing, or edema.  2+ distal pulses. LYMPHATIC: No Axillary, Supraclavicular, or Inguinal Adenopathy.    Assessment:   Annual gynecologic examination 57 y.o. Contraception: status post hysterectomy BMI-21 Tobacco user Vasomotor symptoms controlled with ERT Vaginal dryness, symptomatic Plan:  Pap: Due 2021 Mammogram: Ordered Stool Guaiac Testing:  Ordered Labs: thru pcp Routine preventative health maintenance measures emphasized: Exercise/Diet/Weight  control, Tobacco Warnings and Alcohol/Substance use risks Estratest is refilled for 1 year Return to Clinic - 1 Year   [facility], [place]   Note: This dictation was prepared with Dragon dictation along with smaller phrase technology. Any transcriptional errors that result from this process are unintentional.

## 2017-08-30 ENCOUNTER — Encounter: Payer: BLUE CROSS/BLUE SHIELD | Admitting: Obstetrics and Gynecology

## 2018-07-30 DEATH — deceased
